# Patient Record
Sex: Male | Born: 1990 | Race: Black or African American | Hispanic: No | Marital: Single | Smoking: Current every day smoker
Health system: Southern US, Community
[De-identification: ages and names within clinical notes are randomized; demographics above are authoritative.]

## PROBLEM LIST (undated history)

## (undated) DIAGNOSIS — Z789 Other specified health status: Secondary | ICD-10-CM

## (undated) HISTORY — PX: NO PAST SURGERIES: SHX2092

---

## 2013-05-24 ENCOUNTER — Emergency Department (HOSPITAL_COMMUNITY)
Admission: EM | Admit: 2013-05-24 | Discharge: 2013-05-25 | Disposition: A | Discharge status: Home / Self Care | Payer: Self-pay | Attending: Emergency Medicine | Admitting: Emergency Medicine

## 2013-05-24 ENCOUNTER — Emergency Department (HOSPITAL_COMMUNITY): Payer: Self-pay

## 2013-05-24 DIAGNOSIS — W3400XA Accidental discharge from unspecified firearms or gun, initial encounter: Secondary | ICD-10-CM | POA: Insufficient documentation

## 2013-05-24 DIAGNOSIS — Z23 Encounter for immunization: Secondary | ICD-10-CM | POA: Insufficient documentation

## 2013-05-24 DIAGNOSIS — Y939 Activity, unspecified: Secondary | ICD-10-CM | POA: Insufficient documentation

## 2013-05-24 DIAGNOSIS — S51809A Unspecified open wound of unspecified forearm, initial encounter: Secondary | ICD-10-CM | POA: Insufficient documentation

## 2013-05-24 DIAGNOSIS — Y92410 Unspecified street and highway as the place of occurrence of the external cause: Secondary | ICD-10-CM | POA: Insufficient documentation

## 2013-05-24 MED ORDER — CEFAZOLIN SODIUM 1-5 GM-% IV SOLN
1.0000 g | Freq: Once | INTRAVENOUS | Status: AC
  Administered 2013-05-25: 1 g via INTRAVENOUS
  Filled 2013-05-24 (×2): qty 50

## 2013-05-24 MED ORDER — MORPHINE SULFATE 4 MG/ML IJ SOLN
4.0000 mg | Freq: Once | INTRAMUSCULAR | Status: AC
  Administered 2013-05-25: 4 mg via INTRAVENOUS
  Filled 2013-05-24: qty 1

## 2013-05-24 MED ORDER — TETANUS-DIPHTH-ACELL PERTUSSIS 5-2.5-18.5 LF-MCG/0.5 IM SUSP
0.5000 mL | Freq: Once | INTRAMUSCULAR | Status: AC
  Administered 2013-05-25: 0.5 mL via INTRAMUSCULAR
  Filled 2013-05-24: qty 0.5
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
## 2013-05-25 ENCOUNTER — Emergency Department (HOSPITAL_COMMUNITY): Payer: Self-pay

## 2013-05-25 ENCOUNTER — Inpatient Hospital Stay (HOSPITAL_COMMUNITY)
Admission: EM | Admit: 2013-05-25 | Discharge: 2013-05-29 | DRG: 464 | Disposition: A | Discharge status: Home / Self Care | Payer: Self-pay | Attending: General Surgery | Admitting: General Surgery

## 2013-05-25 DIAGNOSIS — Z23 Encounter for immunization: Secondary | ICD-10-CM

## 2013-05-25 DIAGNOSIS — D62 Acute posthemorrhagic anemia: Secondary | ICD-10-CM | POA: Diagnosis present

## 2013-05-25 DIAGNOSIS — S51809A Unspecified open wound of unspecified forearm, initial encounter: Secondary | ICD-10-CM | POA: Diagnosis present

## 2013-05-25 DIAGNOSIS — F1010 Alcohol abuse, uncomplicated: Secondary | ICD-10-CM | POA: Diagnosis present

## 2013-05-25 DIAGNOSIS — S71009A Unspecified open wound, unspecified hip, initial encounter: Secondary | ICD-10-CM | POA: Diagnosis present

## 2013-05-25 DIAGNOSIS — S42301A Unspecified fracture of shaft of humerus, right arm, initial encounter for closed fracture: Secondary | ICD-10-CM

## 2013-05-25 DIAGNOSIS — IMO0002 Reserved for concepts with insufficient information to code with codable children: Secondary | ICD-10-CM | POA: Diagnosis present

## 2013-05-25 DIAGNOSIS — T07XXXA Unspecified multiple injuries, initial encounter: Secondary | ICD-10-CM | POA: Diagnosis present

## 2013-05-25 DIAGNOSIS — F1210 Cannabis abuse, uncomplicated: Secondary | ICD-10-CM | POA: Diagnosis present

## 2013-05-25 DIAGNOSIS — S81009A Unspecified open wound, unspecified knee, initial encounter: Secondary | ICD-10-CM | POA: Diagnosis present

## 2013-05-25 DIAGNOSIS — S42293B Other displaced fracture of upper end of unspecified humerus, initial encounter for open fracture: Principal | ICD-10-CM | POA: Diagnosis present

## 2013-05-25 DIAGNOSIS — S71109A Unspecified open wound, unspecified thigh, initial encounter: Secondary | ICD-10-CM | POA: Diagnosis present

## 2013-05-25 DIAGNOSIS — F17200 Nicotine dependence, unspecified, uncomplicated: Secondary | ICD-10-CM | POA: Diagnosis present

## 2013-05-25 DIAGNOSIS — W3400XA Accidental discharge from unspecified firearms or gun, initial encounter: Secondary | ICD-10-CM

## 2013-05-25 HISTORY — DX: Other specified health status: Z78.9

## 2013-05-25 LAB — POCT I-STAT, CHEM 8
BUN: 10 mg/dL (ref 6–23)
Calcium, Ion: 1.14 mmol/L (ref 1.12–1.23)
Chloride: 98 mEq/L (ref 96–112)
Glucose, Bld: 173 mg/dL — ABNORMAL HIGH (ref 70–99)
HCT: 49 % (ref 39.0–52.0)
Potassium: 3.4 mEq/L — ABNORMAL LOW (ref 3.5–5.1)

## 2013-05-25 LAB — CBC
Hemoglobin: 15.7 g/dL (ref 13.0–17.0)
MCH: 32.1 pg (ref 26.0–34.0)
MCHC: 34.9 g/dL (ref 30.0–36.0)
WBC: 12.1 10*3/uL — ABNORMAL HIGH (ref 4.0–10.5)

## 2013-05-25 MED ORDER — SODIUM CHLORIDE 0.9 % IV BOLUS (SEPSIS)
1000.0000 mL | Freq: Once | INTRAVENOUS | Status: AC
  Administered 2013-05-26: 1000 mL via INTRAVENOUS

## 2013-05-25 MED ORDER — SODIUM CHLORIDE 0.9 % IV BOLUS (SEPSIS)
1000.0000 mL | Freq: Once | INTRAVENOUS | Status: AC
  Administered 2013-05-25: 1000 mL via INTRAVENOUS

## 2013-05-25 MED ORDER — FENTANYL CITRATE 0.05 MG/ML IJ SOLN
INTRAMUSCULAR | Status: AC
  Administered 2013-05-25: 50 ug
  Filled 2013-05-25: qty 2

## 2013-05-25 MED ORDER — CEFAZOLIN SODIUM-DEXTROSE 2-3 GM-% IV SOLR
INTRAVENOUS | Status: AC
  Administered 2013-05-25: 2000 mg
  Filled 2013-05-25: qty 50

## 2013-05-25 MED ORDER — CEPHALEXIN 500 MG PO CAPS: 500.0000 mg | ORAL_CAPSULE | Freq: Four times a day (QID) | ORAL | Status: AC

## 2013-05-25 MED ORDER — HYDROCODONE-ACETAMINOPHEN 5-325 MG PO TABS: 2.0000 | ORAL_TABLET | ORAL | Status: AC | PRN
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
## 2013-05-26 ENCOUNTER — Encounter (HOSPITAL_COMMUNITY): Payer: Self-pay | Admitting: Emergency Medicine

## 2013-05-26 ENCOUNTER — Inpatient Hospital Stay (HOSPITAL_COMMUNITY): Payer: Self-pay

## 2013-05-26 ENCOUNTER — Emergency Department (HOSPITAL_COMMUNITY): Payer: Self-pay

## 2013-05-26 ENCOUNTER — Encounter (HOSPITAL_COMMUNITY): Admission: EM | Disposition: A | Discharge status: Home / Self Care | Payer: Self-pay | Source: Home / Self Care

## 2013-05-26 ENCOUNTER — Inpatient Hospital Stay (HOSPITAL_COMMUNITY): Payer: Self-pay | Admitting: Anesthesiology

## 2013-05-26 ENCOUNTER — Encounter (HOSPITAL_COMMUNITY): Payer: Self-pay | Admitting: Anesthesiology

## 2013-05-26 DIAGNOSIS — S71009A Unspecified open wound, unspecified hip, initial encounter: Secondary | ICD-10-CM

## 2013-05-26 DIAGNOSIS — S81809A Unspecified open wound, unspecified lower leg, initial encounter: Secondary | ICD-10-CM

## 2013-05-26 DIAGNOSIS — S91009A Unspecified open wound, unspecified ankle, initial encounter: Secondary | ICD-10-CM

## 2013-05-26 DIAGNOSIS — S81009A Unspecified open wound, unspecified knee, initial encounter: Secondary | ICD-10-CM

## 2013-05-26 DIAGNOSIS — S71109A Unspecified open wound, unspecified thigh, initial encounter: Secondary | ICD-10-CM

## 2013-05-26 HISTORY — PX: I & D EXTREMITY: SHX5045

## 2013-05-26 HISTORY — PX: ORIF HUMERUS FRACTURE: SHX2126

## 2013-05-26 LAB — BASIC METABOLIC PANEL
BUN: 8 mg/dL (ref 6–23)
CO2: 22 mEq/L (ref 19–32)
Chloride: 103 mEq/L (ref 96–112)
Creatinine, Ser: 0.92 mg/dL (ref 0.50–1.35)
GFR calc Af Amer: >90 mL/min (ref >90)
Glucose, Bld: 104 mg/dL — ABNORMAL HIGH (ref 70–99)
Potassium: 3.9 mEq/L (ref 3.5–5.1)

## 2013-05-26 LAB — COMPREHENSIVE METABOLIC PANEL
ALT: 12 U/L (ref 0–53)
AST: 27 U/L (ref 0–37)
Alkaline Phosphatase: 56 U/L (ref 39–117)
BUN: 11 mg/dL (ref 6–23)
CO2: 28 mEq/L (ref 19–32)
Calcium: 9 mg/dL (ref 8.4–10.5)
Chloride: 99 mEq/L (ref 96–112)
GFR calc Af Amer: >90 mL/min (ref >90)
GFR calc non Af Amer: 86 mL/min — ABNORMAL LOW (ref >90)
Potassium: 3.8 mEq/L (ref 3.5–5.1)
Sodium: 138 mEq/L (ref 135–145)

## 2013-05-26 LAB — TYPE AND SCREEN: Antibody Screen: NEGATIVE

## 2013-05-26 LAB — URINALYSIS, ROUTINE W REFLEX MICROSCOPIC
Bilirubin Urine: NEGATIVE
Glucose, UA: 250 mg/dL — AB
Hgb urine dipstick: NEGATIVE
Specific Gravity, Urine: 1.01 (ref 1.005–1.030)
pH: 6.5 (ref 5.0–8.0)

## 2013-05-26 LAB — APTT: aPTT: 29 s (ref 24–37)

## 2013-05-26 LAB — ABO/RH: ABO/RH(D): O POS

## 2013-05-26 LAB — RAPID URINE DRUG SCREEN, HOSP PERFORMED
Barbiturates: NOT DETECTED
Benzodiazepines: NOT DETECTED
Opiates: POSITIVE — AB

## 2013-05-26 LAB — CBC
HCT: 37.9 % — ABNORMAL LOW (ref 39.0–52.0)
Hemoglobin: 13.2 g/dL (ref 13.0–17.0)
MCH: 31.6 pg (ref 26.0–34.0)
MCV: 90.7 fL (ref 78.0–100.0)
RDW: 12 % (ref 11.5–15.5)
WBC: 10.6 10*3/uL — ABNORMAL HIGH (ref 4.0–10.5)

## 2013-05-26 LAB — PROTIME-INR
INR: 1.02 (ref 0.00–1.49)
Prothrombin Time: 13.2 s (ref 11.6–15.2)

## 2013-05-26 SURGERY — IRRIGATION AND DEBRIDEMENT EXTREMITY
Anesthesia: General | Site: Shoulder | Laterality: Right | Wound class: Contaminated

## 2013-05-26 MED ORDER — SUFENTANIL CITRATE 50 MCG/ML IV SOLN
INTRAVENOUS | Status: DC | PRN
  Administered 2013-05-26: 10 ug via INTRAVENOUS
  Administered 2013-05-26: 20 ug via INTRAVENOUS
  Administered 2013-05-26 (×3): 10 ug via INTRAVENOUS

## 2013-05-26 MED ORDER — MIDAZOLAM HCL 5 MG/5ML IJ SOLN
INTRAMUSCULAR | Status: DC | PRN
  Administered 2013-05-26 (×2): 1 mg via INTRAVENOUS

## 2013-05-26 MED ORDER — IOHEXOL 300 MG/ML  SOLN
100.0000 mL | Freq: Once | INTRAMUSCULAR | Status: AC | PRN
  Administered 2013-05-26: 100 mL via INTRAVENOUS

## 2013-05-26 MED ORDER — ONDANSETRON HCL 4 MG/2ML IJ SOLN: 4.0000 mg | Freq: Four times a day (QID) | INTRAMUSCULAR | Status: DC | PRN

## 2013-05-26 MED ORDER — NALOXONE HCL 0.4 MG/ML IJ SOLN: 0.4000 mg | INTRAMUSCULAR | Status: DC | PRN

## 2013-05-26 MED ORDER — CEFAZOLIN SODIUM 1-5 GM-% IV SOLN
1.0000 g | Freq: Four times a day (QID) | INTRAVENOUS | Status: AC
  Administered 2013-05-26 – 2013-05-27 (×3): 1 g via INTRAVENOUS
  Filled 2013-05-26 (×3): qty 50

## 2013-05-26 MED ORDER — ROCURONIUM BROMIDE 100 MG/10ML IV SOLN
INTRAVENOUS | Status: DC | PRN
  Administered 2013-05-26: 10 mg via INTRAVENOUS
  Administered 2013-05-26: 50 mg via INTRAVENOUS

## 2013-05-26 MED ORDER — HYDROMORPHONE 0.3 MG/ML IV SOLN
INTRAVENOUS | Status: DC
  Administered 2013-05-26: 1.5 mg via INTRAVENOUS
  Administered 2013-05-26: 1.8 mg via INTRAVENOUS
  Administered 2013-05-26: 0.3 mg via INTRAVENOUS
  Administered 2013-05-26: 2.1 mg via INTRAVENOUS
  Administered 2013-05-27: 2.37 mg via INTRAVENOUS
  Administered 2013-05-27: 08:00:00 via INTRAVENOUS
  Administered 2013-05-27: 2.4 mg via INTRAVENOUS
  Filled 2013-05-26 (×3): qty 25

## 2013-05-26 MED ORDER — ACETAMINOPHEN 325 MG PO TABS
650.0000 mg | ORAL_TABLET | Freq: Four times a day (QID) | ORAL | Status: DC | PRN
  Administered 2013-05-27: 650 mg via ORAL
  Filled 2013-05-26: qty 2

## 2013-05-26 MED ORDER — SODIUM CHLORIDE 0.9 % IJ SOLN: 9.0000 mL | INTRAMUSCULAR | Status: DC | PRN

## 2013-05-26 MED ORDER — MORPHINE SULFATE 2 MG/ML IJ SOLN
2.0000 mg | INTRAMUSCULAR | Status: DC | PRN
  Administered 2013-05-26 (×2): 2 mg via INTRAVENOUS
  Filled 2013-05-26: qty 1

## 2013-05-26 MED ORDER — METOCLOPRAMIDE HCL 10 MG PO TABS: 5.0000 mg | ORAL_TABLET | Freq: Three times a day (TID) | ORAL | Status: DC | PRN

## 2013-05-26 MED ORDER — PROMETHAZINE HCL 25 MG/ML IJ SOLN: 6.2500 mg | INTRAMUSCULAR | Status: DC | PRN

## 2013-05-26 MED ORDER — ONDANSETRON HCL 4 MG/2ML IJ SOLN
INTRAMUSCULAR | Status: DC | PRN
  Administered 2013-05-26: 4 mg via INTRAVENOUS

## 2013-05-26 MED ORDER — DIPHENHYDRAMINE HCL 50 MG/ML IJ SOLN
12.5000 mg | Freq: Four times a day (QID) | INTRAMUSCULAR | Status: DC | PRN
  Filled 2013-05-26: qty 1

## 2013-05-26 MED ORDER — OXYCODONE HCL 5 MG PO TABS: 5.0000 mg | ORAL_TABLET | ORAL | Status: DC | PRN

## 2013-05-26 MED ORDER — INFLUENZA VAC SPLIT QUAD 0.5 ML IM SUSP
0.5000 mL | INTRAMUSCULAR | Status: AC
  Administered 2013-05-27: 0.5 mL via INTRAMUSCULAR
  Filled 2013-05-26: qty 0.5

## 2013-05-26 MED ORDER — MORPHINE SULFATE 2 MG/ML IJ SOLN
2.0000 mg | INTRAMUSCULAR | Status: DC | PRN
  Administered 2013-05-26: 2 mg via INTRAVENOUS
  Filled 2013-05-26 (×2): qty 1

## 2013-05-26 MED ORDER — IBUPROFEN 600 MG PO TABS
600.0000 mg | ORAL_TABLET | Freq: Four times a day (QID) | ORAL | Status: DC | PRN
  Filled 2013-05-26: qty 1

## 2013-05-26 MED ORDER — OXYCODONE HCL 5 MG PO TABS: 5.0000 mg | ORAL_TABLET | Freq: Once | ORAL | Status: DC | PRN

## 2013-05-26 MED ORDER — ONDANSETRON HCL 4 MG PO TABS: 4.0000 mg | ORAL_TABLET | Freq: Four times a day (QID) | ORAL | Status: DC | PRN

## 2013-05-26 MED ORDER — GLYCOPYRROLATE 0.2 MG/ML IJ SOLN
INTRAMUSCULAR | Status: DC | PRN
  Administered 2013-05-26: 0.6 mg via INTRAVENOUS

## 2013-05-26 MED ORDER — MIDAZOLAM HCL 2 MG/2ML IJ SOLN: 1.0000 mg | INTRAMUSCULAR | Status: DC | PRN

## 2013-05-26 MED ORDER — IBUPROFEN 600 MG PO TABS: 600.0000 mg | ORAL_TABLET | Freq: Four times a day (QID) | ORAL | Status: DC | PRN

## 2013-05-26 MED ORDER — SODIUM CHLORIDE 0.9 % IV SOLN
INTRAVENOUS | Status: DC
  Administered 2013-05-26: 05:00:00 via INTRAVENOUS

## 2013-05-26 MED ORDER — METHOCARBAMOL 100 MG/ML IJ SOLN
1000.0000 mg | Freq: Four times a day (QID) | INTRAVENOUS | Status: DC
  Administered 2013-05-26 (×3): 1000 mg via INTRAVENOUS
  Filled 2013-05-26 (×3): qty 10

## 2013-05-26 MED ORDER — ALBUMIN HUMAN 5 % IV SOLN
INTRAVENOUS | Status: DC | PRN
  Administered 2013-05-26 (×2): via INTRAVENOUS

## 2013-05-26 MED ORDER — FENTANYL CITRATE 0.05 MG/ML IJ SOLN: 50.0000 ug | Freq: Once | INTRAMUSCULAR | Status: DC

## 2013-05-26 MED ORDER — METHOCARBAMOL 500 MG PO TABS
1000.0000 mg | ORAL_TABLET | Freq: Four times a day (QID) | ORAL | Status: DC
  Administered 2013-05-27 – 2013-05-29 (×9): 1000 mg via ORAL
  Filled 2013-05-26 (×13): qty 2

## 2013-05-26 MED ORDER — SODIUM CHLORIDE 0.9 % IR SOLN
Status: DC | PRN
  Administered 2013-05-26: 3000 mL

## 2013-05-26 MED ORDER — PROPOFOL 10 MG/ML IV BOLUS
INTRAVENOUS | Status: DC | PRN
  Administered 2013-05-26: 180 mg via INTRAVENOUS

## 2013-05-26 MED ORDER — LIDOCAINE HCL (CARDIAC) 20 MG/ML IV SOLN
INTRAVENOUS | Status: DC | PRN
  Administered 2013-05-26: 100 mg via INTRAVENOUS

## 2013-05-26 MED ORDER — HYDROMORPHONE HCL PF 1 MG/ML IJ SOLN
INTRAMUSCULAR | Status: AC
  Administered 2013-05-26: 0.5 mg via INTRAVENOUS
  Filled 2013-05-26: qty 1

## 2013-05-26 MED ORDER — DOCUSATE SODIUM 100 MG PO CAPS
100.0000 mg | ORAL_CAPSULE | Freq: Two times a day (BID) | ORAL | Status: DC
  Administered 2013-05-26 – 2013-05-29 (×7): 100 mg via ORAL
  Filled 2013-05-26 (×7): qty 1

## 2013-05-26 MED ORDER — PNEUMOCOCCAL VAC POLYVALENT 25 MCG/0.5ML IJ INJ
0.5000 mL | INJECTION | INTRAMUSCULAR | Status: AC
  Administered 2013-05-27: 0.5 mL via INTRAMUSCULAR
  Filled 2013-05-26: qty 0.5

## 2013-05-26 MED ORDER — OXYCODONE HCL 5 MG/5ML PO SOLN: 5.0000 mg | Freq: Once | ORAL | Status: DC | PRN

## 2013-05-26 MED ORDER — BACITRACIN ZINC 500 UNIT/GM EX OINT
TOPICAL_OINTMENT | Freq: Two times a day (BID) | CUTANEOUS | Status: DC
  Administered 2013-05-26 – 2013-05-28 (×4): via TOPICAL
  Filled 2013-05-26 (×2): qty 28.35

## 2013-05-26 MED ORDER — OXYCODONE HCL 5 MG PO TABS
5.0000 mg | ORAL_TABLET | ORAL | Status: DC | PRN
  Administered 2013-05-27 (×6): 15 mg via ORAL
  Administered 2013-05-28: 10 mg via ORAL
  Administered 2013-05-28: 15 mg via ORAL
  Administered 2013-05-28: 10 mg via ORAL
  Administered 2013-05-28 – 2013-05-29 (×3): 15 mg via ORAL
  Filled 2013-05-26 (×9): qty 3
  Filled 2013-05-26: qty 2
  Filled 2013-05-26: qty 3
  Filled 2013-05-26: qty 2

## 2013-05-26 MED ORDER — NEOSTIGMINE METHYLSULFATE 1 MG/ML IJ SOLN
INTRAMUSCULAR | Status: DC | PRN
  Administered 2013-05-26: 5 mg via INTRAVENOUS

## 2013-05-26 MED ORDER — CEFAZOLIN SODIUM 1-5 GM-% IV SOLN
INTRAVENOUS | Status: DC | PRN
  Administered 2013-05-26: 2 g via INTRAVENOUS

## 2013-05-26 MED ORDER — HYDROMORPHONE HCL PF 1 MG/ML IJ SOLN
0.2500 mg | INTRAMUSCULAR | Status: DC | PRN
  Administered 2013-05-26 (×2): 0.5 mg via INTRAVENOUS

## 2013-05-26 MED ORDER — DIPHENHYDRAMINE HCL 12.5 MG/5ML PO ELIX
12.5000 mg | ORAL_SOLUTION | Freq: Four times a day (QID) | ORAL | Status: DC | PRN
  Administered 2013-05-27: 12.5 mg via ORAL
  Filled 2013-05-26 (×2): qty 10

## 2013-05-26 MED ORDER — ONDANSETRON HCL 4 MG PO TABS
4.0000 mg | ORAL_TABLET | Freq: Four times a day (QID) | ORAL | Status: DC | PRN
  Administered 2013-05-27 – 2013-05-29 (×6): 4 mg via ORAL
  Filled 2013-05-26 (×6): qty 1

## 2013-05-26 MED ORDER — METOCLOPRAMIDE HCL 5 MG/ML IJ SOLN: 5.0000 mg | Freq: Three times a day (TID) | INTRAMUSCULAR | Status: DC | PRN

## 2013-05-26 MED ORDER — LACTATED RINGERS IV SOLN
INTRAVENOUS | Status: DC | PRN
  Administered 2013-05-26 (×4): via INTRAVENOUS

## 2013-05-26 SURGICAL SUPPLY — 94 items
BANDAGE ELASTIC 4 VELCRO ST LF (GAUZE/BANDAGES/DRESSINGS) ×3 IMPLANT
BANDAGE ELASTIC 6 VELCRO ST LF (GAUZE/BANDAGES/DRESSINGS) ×3 IMPLANT
BANDAGE GAUZE ELAST BULKY 4 IN (GAUZE/BANDAGES/DRESSINGS) ×6 IMPLANT
BARRIER SKIN 2 1/4 (WOUND CARE) ×6 IMPLANT
BENZOIN TINCTURE PRP APPL 2/3 (GAUZE/BANDAGES/DRESSINGS) ×6 IMPLANT
BIT DRILL 2.5X110 QC LCP DISP (BIT) ×3 IMPLANT
BIT DRILL QC 3.5X110 (BIT) ×6 IMPLANT
BLADE SURG 10 STRL SS (BLADE) ×3 IMPLANT
BNDG COHESIVE 4X5 TAN STRL (GAUZE/BANDAGES/DRESSINGS) ×3 IMPLANT
BNDG GAUZE STRTCH 6 (GAUZE/BANDAGES/DRESSINGS) ×9 IMPLANT
BRUSH SCRUB DISP (MISCELLANEOUS) ×6 IMPLANT
CANISTER WOUND CARE 500ML ATS (WOUND CARE) ×3 IMPLANT
CLOTH BEACON ORANGE TIMEOUT ST (SAFETY) IMPLANT
COVER SURGICAL LIGHT HANDLE (MISCELLANEOUS) ×6 IMPLANT
DRAPE C-ARM 42X72 X-RAY (DRAPES) ×6 IMPLANT
DRAPE C-ARMOR (DRAPES) IMPLANT
DRAPE INCISE IOBAN 66X45 STRL (DRAPES) IMPLANT
DRAPE ORTHO SPLIT 77X108 STRL (DRAPES) ×2
DRAPE SURG 17X11 SM STRL (DRAPES) ×6 IMPLANT
DRAPE SURG ORHT 6 SPLT 77X108 (DRAPES) ×4 IMPLANT
DRAPE U-SHAPE 47X51 STRL (DRAPES) ×6 IMPLANT
DRSG ADAPTIC 3X8 NADH LF (GAUZE/BANDAGES/DRESSINGS) ×3 IMPLANT
DRSG MEPILEX BORDER 4X4 (GAUZE/BANDAGES/DRESSINGS) ×3 IMPLANT
DRSG MEPITEL 4X7.2 (GAUZE/BANDAGES/DRESSINGS) ×3 IMPLANT
DRSG PAD ABDOMINAL 8X10 ST (GAUZE/BANDAGES/DRESSINGS) ×3 IMPLANT
DRSG VAC ATS MED SENSATRAC (GAUZE/BANDAGES/DRESSINGS) ×3 IMPLANT
ELECT CAUTERY BLADE 6.4 (BLADE) IMPLANT
ELECT REM PT RETURN 9FT ADLT (ELECTROSURGICAL) ×3
ELECTRODE REM PT RTRN 9FT ADLT (ELECTROSURGICAL) ×2 IMPLANT
EVACUATOR 1/8 PVC DRAIN (DRAIN) IMPLANT
GLOVE BIO SURGEON STRL SZ7.5 (GLOVE) ×3 IMPLANT
GLOVE BIO SURGEON STRL SZ8 (GLOVE) ×6 IMPLANT
GLOVE BIOGEL PI IND STRL 7.5 (GLOVE) ×2 IMPLANT
GLOVE BIOGEL PI IND STRL 8 (GLOVE) ×2 IMPLANT
GLOVE BIOGEL PI INDICATOR 7.5 (GLOVE) ×1
GLOVE BIOGEL PI INDICATOR 8 (GLOVE) ×1
GOWN PREVENTION PLUS XLARGE (GOWN DISPOSABLE) ×3 IMPLANT
GOWN PREVENTION PLUS XXLARGE (GOWN DISPOSABLE) ×3 IMPLANT
GOWN STRL NON-REIN LRG LVL3 (GOWN DISPOSABLE) ×6 IMPLANT
HANDPIECE INTERPULSE COAX TIP (DISPOSABLE)
KIT BASIN OR (CUSTOM PROCEDURE TRAY) ×3 IMPLANT
KIT ROOM TURNOVER OR (KITS) ×3 IMPLANT
LOOP VESSEL MAXI BLUE (MISCELLANEOUS) IMPLANT
MANIFOLD NEPTUNE II (INSTRUMENTS) ×3 IMPLANT
NS IRRIG 1000ML POUR BTL (IV SOLUTION) ×3 IMPLANT
PACK ORTHO EXTREMITY (CUSTOM PROCEDURE TRAY) ×3 IMPLANT
PACK TOTAL JOINT (CUSTOM PROCEDURE TRAY) IMPLANT
PAD ARMBOARD 7.5X6 YLW CONV (MISCELLANEOUS) ×6 IMPLANT
PADDING CAST COTTON 6X4 STRL (CAST SUPPLIES) ×3 IMPLANT
PROS LCP PLATE 14 189M (Plate) ×3 IMPLANT
PROSTHESIS LCP PLATE 14 189M (Plate) ×2 IMPLANT
SCREW CANC FT 4.0X50 (Screw) ×3 IMPLANT
SCREW CORTEX 3.5 32MM (Screw) ×1 IMPLANT
SCREW CORTEX 3.5 34MM (Screw) ×2 IMPLANT
SCREW CORTEX 3.5 50MM (Screw) ×3 IMPLANT
SCREW LOCK CORT ST 3.5X32 (Screw) ×2 IMPLANT
SCREW LOCK CORT ST 3.5X34 (Screw) ×4 IMPLANT
SCREW LOCK T15 FT 14X3.5X2.9X (Screw) ×2 IMPLANT
SCREW LOCK T15 FT 32X3.5X2.9X (Screw) ×2 IMPLANT
SCREW LOCKING 3.5X14 (Screw) ×1 IMPLANT
SCREW LOCKING 3.5X32 (Screw) ×1 IMPLANT
SCREW LOCKING 3.5X34 (Screw) ×6 IMPLANT
SCREW LOCKING 3.5X50 (Screw) ×3 IMPLANT
SET HNDPC FAN SPRY TIP SCT (DISPOSABLE) IMPLANT
SPONGE GAUZE 4X4 12PLY (GAUZE/BANDAGES/DRESSINGS) ×6 IMPLANT
SPONGE LAP 18X18 X RAY DECT (DISPOSABLE) ×6 IMPLANT
STAPLER VISISTAT 35W (STAPLE) ×3 IMPLANT
STOCKINETTE IMPERVIOUS 9X36 MD (GAUZE/BANDAGES/DRESSINGS) ×3 IMPLANT
STOCKINETTE IMPERVIOUS LG (DRAPES) IMPLANT
SUCTION FRAZIER TIP 10 FR DISP (SUCTIONS) ×3 IMPLANT
SUT ETHIBOND 5 LR DA (SUTURE) ×3 IMPLANT
SUT ETHILON 2 0 FS 18 (SUTURE) ×3 IMPLANT
SUT ETHILON 3 0 PS 1 (SUTURE) ×3 IMPLANT
SUT FIBERWIRE #2 38 T-5 BLUE (SUTURE)
SUT PDS AB 2-0 CT1 27 (SUTURE) IMPLANT
SUT SILK 2 0 (SUTURE) ×1
SUT SILK 2-0 18XBRD TIE 12 (SUTURE) ×2 IMPLANT
SUT VIC AB 0 CT1 27 (SUTURE) ×2
SUT VIC AB 0 CT1 27XBRD ANBCTR (SUTURE) ×4 IMPLANT
SUT VIC AB 1 CT1 27 (SUTURE) ×1
SUT VIC AB 1 CT1 27XBRD ANBCTR (SUTURE) ×2 IMPLANT
SUT VIC AB 2-0 CT1 27 (SUTURE) ×6
SUT VIC AB 2-0 CT1 TAPERPNT 27 (SUTURE) ×12 IMPLANT
SUT VIC AB 2-0 CT3 27 (SUTURE) IMPLANT
SUTURE FIBERWR #2 38 T-5 BLUE (SUTURE) IMPLANT
SYR 5ML LL (SYRINGE) IMPLANT
TOWEL OR 17X24 6PK STRL BLUE (TOWEL DISPOSABLE) ×3 IMPLANT
TOWEL OR 17X26 10 PK STRL BLUE (TOWEL DISPOSABLE) ×6 IMPLANT
TRAY FOLEY CATH 16FRSI W/METER (SET/KITS/TRAYS/PACK) ×3 IMPLANT
TUBE ANAEROBIC SPECIMEN COL (MISCELLANEOUS) IMPLANT
TUBE CONNECTING 12X1/4 (SUCTIONS) ×3 IMPLANT
UNDERPAD 30X30 INCONTINENT (UNDERPADS AND DIAPERS) ×12 IMPLANT
WATER STERILE IRR 1000ML POUR (IV SOLUTION) ×3 IMPLANT
YANKAUER SUCT BULB TIP NO VENT (SUCTIONS) ×3 IMPLANT

## 2013-05-26 NOTE — Anesthesia Preprocedure Evaluation (Addendum)
Anesthesia Evaluation  Patient identified by MRN, date of birth, ID band Patient awake    Reviewed: Allergy & Precautions, H&P , NPO status , Patient's Chart, lab work & pertinent test results  Airway Mallampati: I TM Distance: >3 FB Neck ROM: Full    Dental  (+) Dental Advisory Given and Teeth Intact   Pulmonary Current Smoker,  breath sounds clear to auscultation        Cardiovascular Rhythm:Regular Rate:Normal     Neuro/Psych    GI/Hepatic (+)     substance abuse  alcohol use and marijuana use,   Endo/Other    Renal/GU      Musculoskeletal   Abdominal   Peds  Hematology   Anesthesia Other Findings   Reproductive/Obstetrics                         Anesthesia Physical Anesthesia Plan  ASA: II and emergent  Anesthesia Plan: General   Post-op Pain Management:    Induction: Intravenous  Airway Management Planned: Oral ETT  Additional Equipment:   Intra-op Plan:   Post-operative Plan: Extubation in OR  Informed Consent: I have reviewed the patients History and Physical, chart, labs and discussed the procedure including the risks, benefits and alternatives for the proposed anesthesia with the patient or authorized representative who has indicated his/her understanding and acceptance.   Dental advisory given  Plan Discussed with: CRNA, Surgeon and Anesthesiologist  Anesthesia Plan Comments:        Anesthesia Quick Evaluation

## 2013-05-26 NOTE — Preoperative (Addendum)
Beta Blockers   Reason not to administer Beta Blockers:not on beta blocker 

## 2013-05-26 NOTE — Transfer of Care (Signed)
Immediate Anesthesia Transfer of Care Note  Patient: Kevin Mercado  Procedure(s) Performed: Procedure(s): IRRIGATION AND DEBRIDEMENT EXTREMITY RIGHT SHOULDER, LEFT PELVIS, RIGHT ANKLE, RIGHT FOREARM (Bilateral) OPEN REDUCTION INTERNAL FIXATION (ORIF) PROXIMAL HUMERUS FRACTURE (Right)  Patient Location: PACU  Anesthesia Type:General  Level of Consciousness: awake and alert    Airway & Oxygen Therapy: Patient Spontanous Breathing and Patient connected to T-piece oxygen  Post-op Assessment: Report given to PACU RN and Post -op Vital signs reviewed and stable  Post vital signs: Reviewed and stable  Complications: No apparent anesthesia complications
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
## 2013-05-26 NOTE — Anesthesia Procedure Notes (Signed)
Procedure Name: Intubation Date/Time: 05/26/2013 8:51 AM Performed by: TRAUMA MD Pre-anesthesia Checklist: Patient identified, Emergency Drugs available, Suction available, Patient being monitored and Timeout performed Patient Re-evaluated:Patient Re-evaluated prior to inductionOxygen Delivery Method: Circle system utilized Preoxygenation: Pre-oxygenation with 100% oxygen Intubation Type: IV induction Ventilation: Mask ventilation without difficulty Laryngoscope Size: Miller and 2 Grade View: Grade I Tube type: Oral Tube size: 7.5 mm Number of attempts: 1 Airway Equipment and Method: Stylet Placement Confirmation: ETT inserted through vocal cords under direct vision,  positive ETCO2 and breath sounds checked- equal and bilateral Secured at: 22 cm Tube secured with: Tape Dental Injury: Teeth and Oropharynx as per pre-operative assessment
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
## 2013-05-26 NOTE — Progress Notes (Signed)
Orthopedic Tech Progress Note Patient Details:  Kevin LOPPNOW 05-08-1991 [id number]  Patient ID: [name], male   DOB: 1990/09/17, 22 y.o.   MRN: [id number] Trapeze bar patient helper  Nikki Dom 05/26/2013, 2:38 PM
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
## 2013-05-26 NOTE — ED Notes (Signed)
Ortho MD at bedside.
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
## 2013-05-27 ENCOUNTER — Encounter (HOSPITAL_COMMUNITY): Payer: Self-pay | Admitting: Orthopedic Surgery

## 2013-05-27 DIAGNOSIS — D62 Acute posthemorrhagic anemia: Secondary | ICD-10-CM

## 2013-05-27 LAB — BASIC METABOLIC PANEL
BUN: 4 mg/dL — ABNORMAL LOW (ref 6–23)
CO2: 28 mEq/L (ref 19–32)
Chloride: 98 mEq/L (ref 96–112)
Creatinine, Ser: 0.85 mg/dL (ref 0.50–1.35)
Potassium: 3.7 mEq/L (ref 3.5–5.1)
Sodium: 135 mEq/L (ref 135–145)

## 2013-05-27 LAB — CBC
HCT: 28.4 % — ABNORMAL LOW (ref 39.0–52.0)
Hemoglobin: 9.7 g/dL — ABNORMAL LOW (ref 13.0–17.0)
MCV: 92.2 fL (ref 78.0–100.0)
RDW: 12.1 % (ref 11.5–15.5)
WBC: 10.9 10*3/uL — ABNORMAL HIGH (ref 4.0–10.5)

## 2013-05-27 MED ORDER — HYDROMORPHONE HCL PF 1 MG/ML IJ SOLN: 1.0000 mg | INTRAMUSCULAR | Status: DC | PRN

## 2013-05-27 MED ORDER — POLYETHYLENE GLYCOL 3350 17 G PO PACK
17.0000 g | PACK | Freq: Every day | ORAL | Status: DC
  Administered 2013-05-27 – 2013-05-28 (×2): 17 g via ORAL
  Filled 2013-05-27 (×5): qty 1

## 2013-05-27 MED ORDER — ONDANSETRON HCL 4 MG/2ML IJ SOLN
4.0000 mg | Freq: Four times a day (QID) | INTRAMUSCULAR | Status: DC
  Administered 2013-05-27 (×2): 4 mg via INTRAVENOUS
  Filled 2013-05-27 (×2): qty 2

## 2013-05-27 MED ORDER — DIPHENHYDRAMINE HCL 25 MG PO CAPS
25.0000 mg | ORAL_CAPSULE | Freq: Four times a day (QID) | ORAL | Status: DC | PRN
  Administered 2013-05-27 – 2013-05-28 (×5): 25 mg via ORAL
  Filled 2013-05-27 (×5): qty 1
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
## 2013-05-27 NOTE — Progress Notes (Signed)
PT Cancellation Note  Patient Details Name: Kevin Mercado MRN: [id number] DOB: 1991/06/01   Cancelled Treatment:    Reason Eval/Treat Not Completed: PT screened, no needs identified, will sign off   Van Clines [facility] 05/27/2013, 1:55 PM
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
## 2013-05-27 NOTE — Progress Notes (Signed)
Appreciate ortho F/U Patient examined and I agree with the assessment and plan  Violeta Gelinas, MD, MPH, FACS Pager: [phone]  05/27/2013 3:27 PM
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
## 2013-05-28 LAB — CBC
HCT: 26.7 % — ABNORMAL LOW (ref 39.0–52.0)
Hemoglobin: 9.4 g/dL — ABNORMAL LOW (ref 13.0–17.0)
MCH: 32 pg (ref 26.0–34.0)
MCHC: 35.2 g/dL (ref 30.0–36.0)
RBC: 2.94 MIL/uL — ABNORMAL LOW (ref 4.22–5.81)
WBC: 9.5 10*3/uL (ref 4.0–10.5)

## 2013-05-28 LAB — BASIC METABOLIC PANEL
BUN: 4 mg/dL — ABNORMAL LOW (ref 6–23)
Chloride: 99 mEq/L (ref 96–112)
GFR calc Af Amer: >90 mL/min (ref >90)
GFR calc non Af Amer: >90 mL/min (ref >90)
Potassium: 4.1 mEq/L (ref 3.5–5.1)
Sodium: 138 mEq/L (ref 135–145)

## 2013-05-28 MED ORDER — OXYCODONE-ACETAMINOPHEN 5-325 MG PO TABS
1.0000 | ORAL_TABLET | Freq: Four times a day (QID) | ORAL | Status: DC | PRN
  Administered 2013-05-28 – 2013-05-29 (×3): 2 via ORAL
  Administered 2013-05-29: 1 via ORAL
  Filled 2013-05-28: qty 1
  Filled 2013-05-28 (×3): qty 2
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
## 2013-05-28 NOTE — Progress Notes (Signed)
Quite sore but getting up to BR. Possible D/C tomorrow after ortho removes VAC. Patient examined and I agree with the assessment and plan  Violeta Gelinas, MD, MPH, FACS Pager: [phone]  05/28/2013 1:42 PM

## 2013-05-29 LAB — BASIC METABOLIC PANEL
BUN: 5 mg/dL — ABNORMAL LOW (ref 6–23)
CO2: 32 mEq/L (ref 19–32)
Calcium: 9 mg/dL (ref 8.4–10.5)
Chloride: 100 mEq/L (ref 96–112)
Glucose, Bld: 120 mg/dL — ABNORMAL HIGH (ref 70–99)
Potassium: 3.4 mEq/L — ABNORMAL LOW (ref 3.5–5.1)
Sodium: 138 mEq/L (ref 135–145)

## 2013-05-29 MED ORDER — ASPIRIN EC 325 MG PO TBEC: 325.0000 mg | DELAYED_RELEASE_TABLET | Freq: Two times a day (BID) | ORAL | Status: AC

## 2013-05-29 MED ORDER — BACITRACIN ZINC 500 UNIT/GM EX OINT: TOPICAL_OINTMENT | Freq: Two times a day (BID) | CUTANEOUS | Status: AC

## 2013-05-29 MED ORDER — ACETAMINOPHEN 325 MG PO TABS: 650.0000 mg | ORAL_TABLET | Freq: Four times a day (QID) | ORAL | Status: AC | PRN

## 2013-05-29 MED ORDER — DSS 100 MG PO CAPS: 100.0000 mg | ORAL_CAPSULE | Freq: Two times a day (BID) | ORAL | Status: AC

## 2013-05-29 MED ORDER — OXYCODONE HCL 5 MG PO TABS: 5.0000 mg | ORAL_TABLET | ORAL | Status: AC | PRN

## 2013-05-29 MED ORDER — METHOCARBAMOL 500 MG PO TABS: 1000.0000 mg | ORAL_TABLET | Freq: Four times a day (QID) | ORAL | Status: AC
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
## 2013-05-30 NOTE — Discharge Summary (Signed)
Kevin Shibuya, MD, MPH, FACS Pager: [phone]  
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
